# Patient Record
Sex: Female | Born: 1995 | Race: Black or African American | Hispanic: No | Marital: Single | Smoking: Never smoker
Health system: Southern US, Community
[De-identification: ages and names within clinical notes are randomized; demographics above are authoritative.]

---
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
## 2015-10-26 ENCOUNTER — Emergency Department (HOSPITAL_COMMUNITY): Payer: BLUE CROSS/BLUE SHIELD

## 2015-10-26 ENCOUNTER — Encounter (HOSPITAL_COMMUNITY): Payer: Self-pay | Admitting: Emergency Medicine

## 2015-10-26 DIAGNOSIS — R079 Chest pain, unspecified: Secondary | ICD-10-CM | POA: Insufficient documentation

## 2015-10-26 LAB — BASIC METABOLIC PANEL
ANION GAP: 9 (ref 5–15)
BUN: 11 mg/dL (ref 6–20)
CALCIUM: 9.2 mg/dL (ref 8.9–10.3)
CO2: 23 mmol/L (ref 22–32)
Chloride: 107 mmol/L (ref 101–111)
Creatinine, Ser: 0.87 mg/dL (ref 0.44–1.00)
Glucose, Bld: 97 mg/dL (ref 65–99)
Potassium: 3.8 mmol/L (ref 3.5–5.1)
SODIUM: 139 mmol/L (ref 135–145)

## 2015-10-26 LAB — CBC
HCT: 33 % — ABNORMAL LOW (ref 36.0–46.0)
HEMOGLOBIN: 10.6 g/dL — AB (ref 12.0–15.0)
MCH: 23.5 pg — ABNORMAL LOW (ref 26.0–34.0)
MCHC: 32.1 g/dL (ref 30.0–36.0)
MCV: 73.2 fL — ABNORMAL LOW (ref 78.0–100.0)
Platelets: 287 10*3/uL (ref 150–400)
RBC: 4.51 MIL/uL (ref 3.87–5.11)
RDW: 14.9 % (ref 11.5–15.5)
WBC: 10.1 10*3/uL (ref 4.0–10.5)

## 2015-10-26 LAB — I-STAT TROPONIN, ED: Troponin i, poc: 0 ng/mL (ref 0.00–0.08)

## 2015-10-26 NOTE — ED Notes (Signed)
Pt. reports intermittent central chest pain " heavy " onset 2 months ago and again at noon today , rates pain 4/10 at arrival , mild SOB with occasional dry cough , no nausea or diaphoresis , no fever or chills.

## 2015-10-27 ENCOUNTER — Emergency Department (HOSPITAL_COMMUNITY)
Admission: EM | Admit: 2015-10-27 | Discharge: 2015-10-27 | Disposition: A | Discharge status: Home / Self Care | Payer: BLUE CROSS/BLUE SHIELD | Attending: Emergency Medicine | Admitting: Emergency Medicine

## 2017-09-08 IMAGING — DX DG CHEST 2V
2 series · 2 of 2 positions shown · non-contrast
Comparison: None.

CLINICAL DATA: 19-year-old female with chest pain and shortness of
breath

EXAM:
CHEST  2 VIEW

[chest pa]
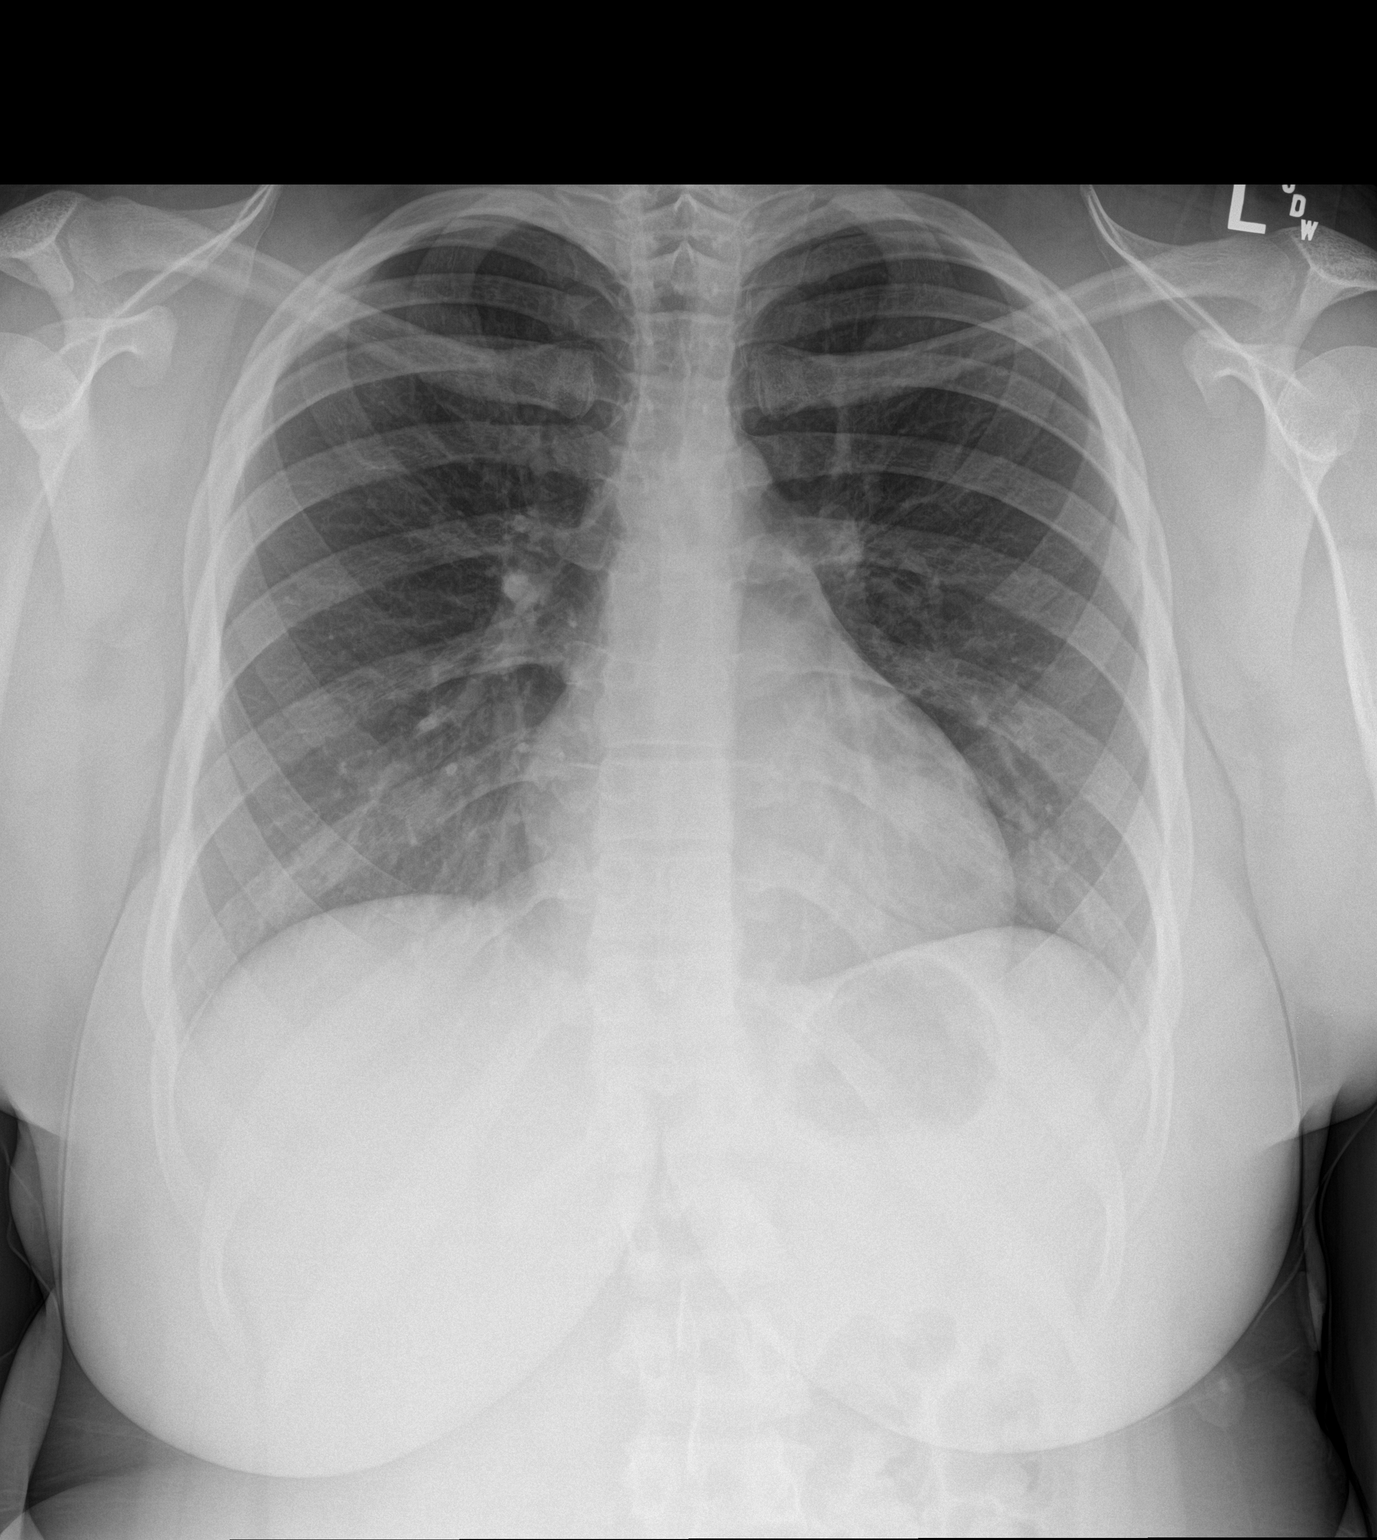

[chest lat]
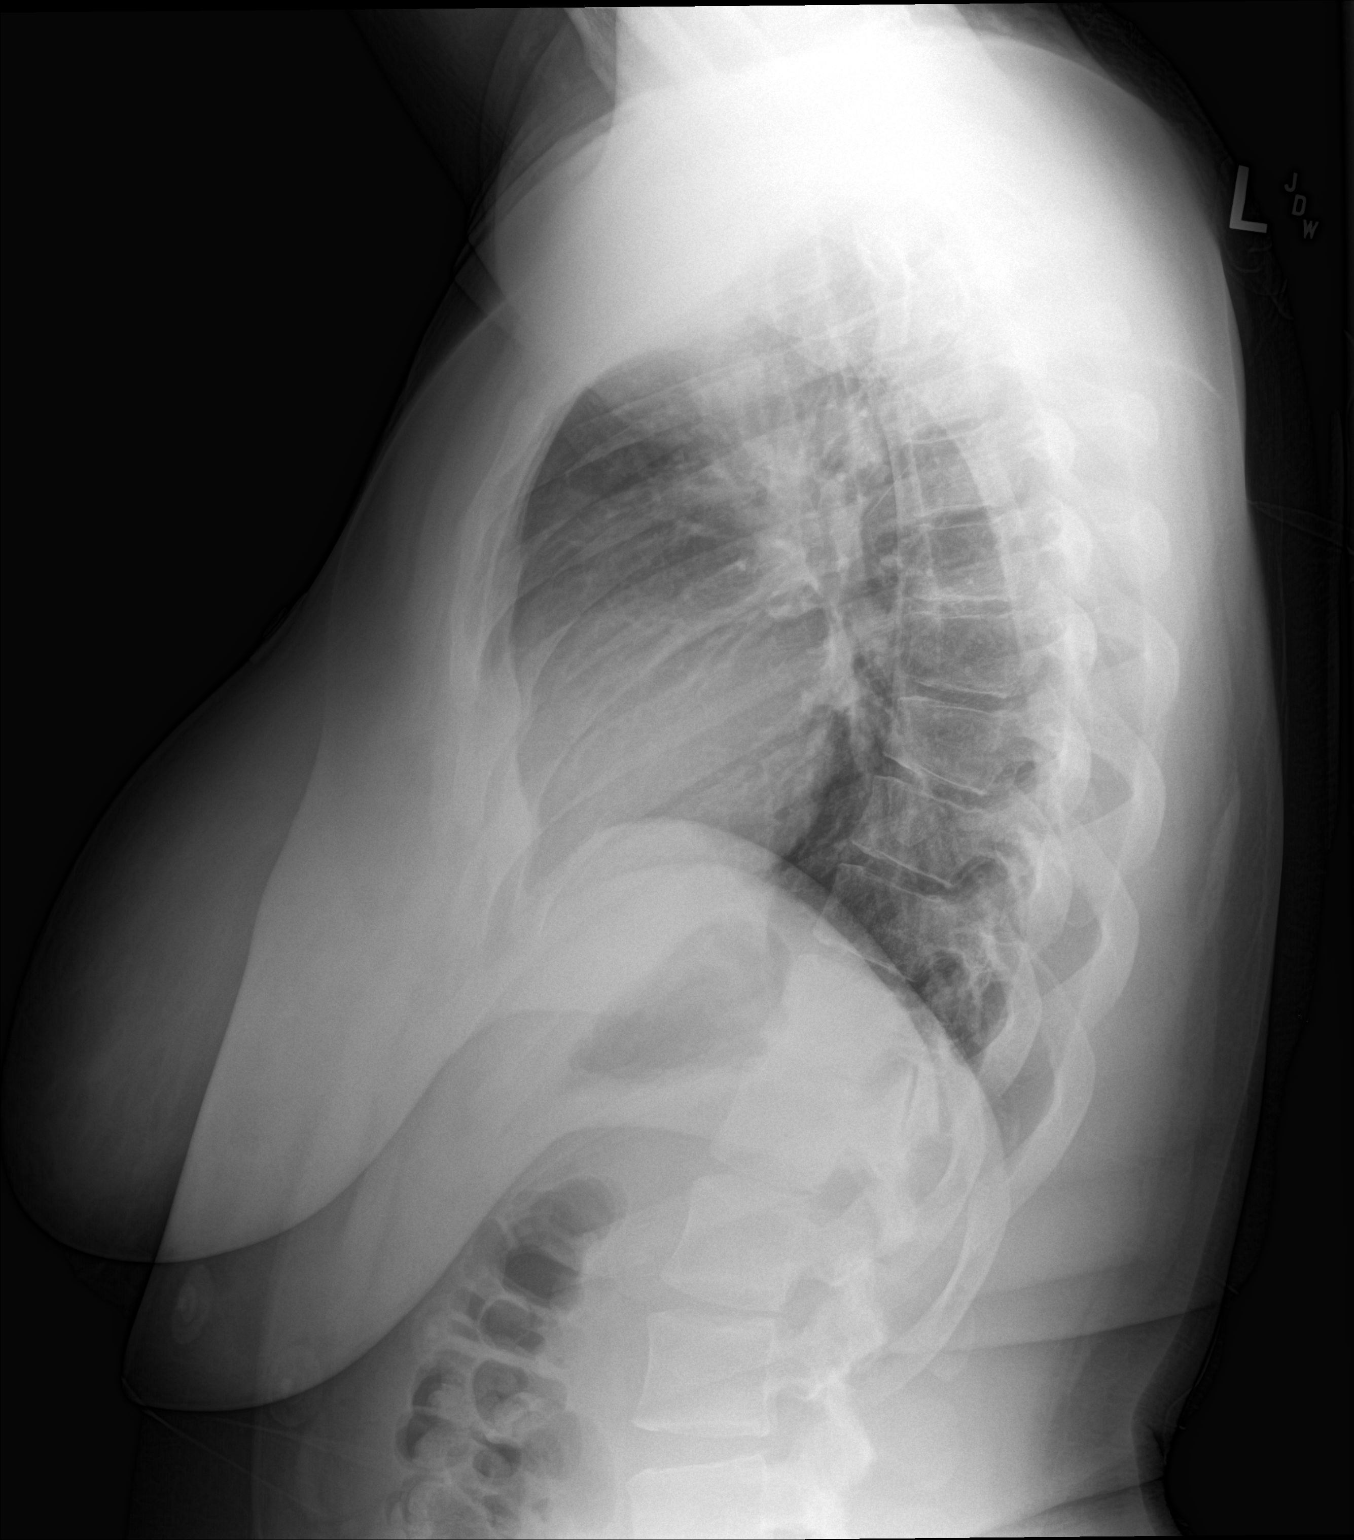

[2 of 2 positions shown; findings below may reference images not displayed]

FINDINGS: The heart size and mediastinal contours are within normal limits.
Both lungs are clear. The visualized skeletal structures are
unremarkable.
IMPRESSION: No active cardiopulmonary disease.
